# Patient Record
Sex: Female | Born: 1960 | Race: White | Hispanic: No | Marital: Married | Smoking: Former smoker
Health system: Southern US, Community
[De-identification: ages and names within clinical notes are randomized; demographics above are authoritative.]

## PROBLEM LIST (undated history)

## (undated) DIAGNOSIS — I10 Essential (primary) hypertension: Secondary | ICD-10-CM

## (undated) DIAGNOSIS — N951 Menopausal and female climacteric states: Secondary | ICD-10-CM

## (undated) DIAGNOSIS — M545 Low back pain: Principal | ICD-10-CM

## (undated) DIAGNOSIS — J309 Allergic rhinitis, unspecified: Secondary | ICD-10-CM

## (undated) DIAGNOSIS — K219 Gastro-esophageal reflux disease without esophagitis: Secondary | ICD-10-CM

## (undated) DIAGNOSIS — M1811 Unilateral primary osteoarthritis of first carpometacarpal joint, right hand: Secondary | ICD-10-CM

## (undated) DIAGNOSIS — G8929 Other chronic pain: Secondary | ICD-10-CM

## (undated) DIAGNOSIS — G43009 Migraine without aura, not intractable, without status migrainosus: Secondary | ICD-10-CM

## (undated) HISTORY — DX: Other chronic pain: G89.29

## (undated) HISTORY — DX: Migraine without aura, not intractable, without status migrainosus: G43.009

## (undated) HISTORY — DX: Menopausal and female climacteric states: N95.1

## (undated) HISTORY — DX: Unilateral primary osteoarthritis of first carpometacarpal joint, right hand: M18.11

## (undated) HISTORY — DX: Gastro-esophageal reflux disease without esophagitis: K21.9

## (undated) HISTORY — DX: Allergic rhinitis, unspecified: J30.9

## (undated) HISTORY — DX: Essential (primary) hypertension: I10

## (undated) HISTORY — DX: Low back pain: M54.5

---
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
## 1989-11-22 HISTORY — PX: APPENDECTOMY: SHX54

## 1992-11-22 HISTORY — PX: LAPAROSCOPIC LYSIS OF ADHESIONS: SHX5905

## 1997-11-22 HISTORY — PX: CHOLECYSTECTOMY: SHX55

## 2000-11-22 HISTORY — PX: TUBAL LIGATION: SHX77

## 2014-05-20 ENCOUNTER — Emergency Department (HOSPITAL_BASED_OUTPATIENT_CLINIC_OR_DEPARTMENT_OTHER): Payer: Medicaid Other

## 2014-05-20 ENCOUNTER — Encounter (HOSPITAL_BASED_OUTPATIENT_CLINIC_OR_DEPARTMENT_OTHER): Payer: Self-pay | Admitting: Emergency Medicine

## 2014-05-20 ENCOUNTER — Emergency Department (HOSPITAL_BASED_OUTPATIENT_CLINIC_OR_DEPARTMENT_OTHER)
Admission: EM | Admit: 2014-05-20 | Discharge: 2014-05-20 | Disposition: A | Discharge status: Home / Self Care | Payer: Medicaid Other | Attending: Emergency Medicine | Admitting: Emergency Medicine

## 2014-05-20 DIAGNOSIS — M545 Low back pain, unspecified: Secondary | ICD-10-CM | POA: Insufficient documentation

## 2014-05-20 MED ORDER — HYDROCODONE-ACETAMINOPHEN 5-325 MG PO TABS: 2.0000 | ORAL_TABLET | ORAL | Status: DC | PRN

## 2014-05-20 MED ORDER — CYCLOBENZAPRINE HCL 10 MG PO TABS: 10.0000 mg | ORAL_TABLET | Freq: Two times a day (BID) | ORAL | Status: DC | PRN

## 2014-05-20 MED ORDER — IBUPROFEN 800 MG PO TABS: 800.0000 mg | ORAL_TABLET | Freq: Three times a day (TID) | ORAL | Status: DC

## 2014-05-20 NOTE — ED Notes (Signed)
Pt c/o " tailbone and lower back pain " x 1 month

## 2014-05-20 NOTE — Discharge Instructions (Signed)

## 2014-05-20 NOTE — ED Provider Notes (Signed)
CSN: [id number]     Arrival date & time 05/20/14  1717 History   First MD Initiated Contact with Patient 05/20/14 1729     Chief Complaint  Patient presents with  . Back Pain     (Consider location/radiation/quality/duration/timing/severity/associated sxs/prior Treatment) Patient is a 53 y.o. female presenting with back pain. The history is provided by the patient. No language interpreter was used.  Back Pain Location:  Lumbar spine Quality:  Aching Radiates to:  Does not radiate Pain severity:  Moderate Pain is:  Unable to specify Onset quality:  Gradual Duration:  12 months Timing:  Intermittent Progression:  Waxing and waning Chronicity:  Recurrent Relieved by:  Nothing Ineffective treatments:  None tried Pt reports pain in low back after mowing yard.   Pt has pain on and off in low back and pelvic area.  History reviewed. No pertinent past medical history. Past Surgical History  Procedure Laterality Date  . Cholecystectomy    . Appendectomy    . Abdominal surgery    . Cesarean section    . Tubal ligation     History reviewed. No pertinent family history. History  Substance Use Topics  . Smoking status: Never Smoker   . Smokeless tobacco: Not on file  . Alcohol Use: No   OB History   Grav Para Term Preterm Abortions TAB SAB Ect Mult Living                 Review of Systems  Musculoskeletal: Positive for back pain.  All other systems reviewed and are negative.     Allergies  Sulfa antibiotics  Home Medications   Prior to Admission medications   Not on File   BP 140/93  Pulse 93  Temp(Src) 97.6 F (36.4 C) (Oral)  Resp 16  Ht 5\' 3"  (1.6 m)  Wt 180 lb (81.647 kg)  BMI 31.89 kg/m2  SpO2 96% Physical Exam  Vitals reviewed. Constitutional: She appears well-developed and well-nourished.  HENT:  Head: Normocephalic and atraumatic.  Right Ear: External ear normal.  Nose: Nose normal.  Mouth/Throat: Oropharynx is clear and moist.  Eyes:  Conjunctivae are normal. Pupils are equal, round, and reactive to light.  Neck: Normal range of motion.  Cardiovascular: Normal rate.   Pulmonary/Chest: Effort normal.  Abdominal: Soft.  Musculoskeletal:  Diffusely tender lower lumbar/sacral area.    Neurological: She is alert.  Skin: Skin is warm.    ED Course  Procedures (including critical care time) Labs Review Labs Reviewed - No data to display  Imaging Review Dg Lumbar Spine Complete  05/20/2014   CLINICAL DATA:  Pain in lower back  EXAM: LUMBAR SPINE - COMPLETE 4+ VIEW  COMPARISON:  None.  FINDINGS: There is no evidence of lumbar spine fracture. Alignment is normal. There is mild multi level disc space narrowing and ventral endplate spurring.  IMPRESSION: 1. Mild degenerative disc disease.   Electronically Signed   By: Signa Kellaylor  Stroud M.D.   On: 05/20/2014 18:51   Dg Pelvis 1-2 Views  05/20/2014   CLINICAL DATA:  Lower back and tailbone pain for 1 month  EXAM: PELVIS - 1-2 VIEW  COMPARISON:  None  FINDINGS: Symmetric hip and SI joints.  Osseous mineralization normal.  No definite fracture, dislocation, or bone destruction.  Few scattered pelvic phleboliths.  IMPRESSION: No acute osseous abnormalities.   Electronically Signed   By: Ulyses SouthwardMark  Boles M.D.   On: 05/20/2014 18:40     EKG Interpretation None  MDM mild degenerative disease disease   Final diagnoses:  Bilateral low back pain without sciatica    Pt has appointment with opc in August  For a check up.     Ibuprofen Flexeril And hydrocodone Pt advised to follow up as scheduled.   Referral to Dr. Pearletha ForgeHudnall for evaluation    Elson AreasLeslie K Opha Mcghee, PA-C 05/20/14 1919

## 2014-05-20 NOTE — ED Provider Notes (Signed)
Medical screening examination/treatment/procedure(s) were performed by non-physician practitioner and as supervising physician I was immediately available for consultation/collaboration.    Linwood DibblesJon Knapp, MD 05/20/14 [phone]

## 2014-11-08 ENCOUNTER — Encounter: Payer: Self-pay | Admitting: Internal Medicine

## 2014-11-08 ENCOUNTER — Ambulatory Visit (INDEPENDENT_AMBULATORY_CARE_PROVIDER_SITE_OTHER): Payer: Self-pay | Admitting: Internal Medicine

## 2014-11-08 VITALS — BP 132/88 | HR 79 | Temp 97.9°F | Wt 173.3 lb

## 2014-11-08 DIAGNOSIS — G43009 Migraine without aura, not intractable, without status migrainosus: Secondary | ICD-10-CM

## 2014-11-08 DIAGNOSIS — M545 Low back pain, unspecified: Secondary | ICD-10-CM

## 2014-11-08 DIAGNOSIS — E669 Obesity, unspecified: Secondary | ICD-10-CM | POA: Insufficient documentation

## 2014-11-08 DIAGNOSIS — N951 Menopausal and female climacteric states: Secondary | ICD-10-CM

## 2014-11-08 DIAGNOSIS — Z23 Encounter for immunization: Secondary | ICD-10-CM

## 2014-11-08 DIAGNOSIS — K219 Gastro-esophageal reflux disease without esophagitis: Secondary | ICD-10-CM

## 2014-11-08 DIAGNOSIS — Z0000 Encounter for general adult medical examination without abnormal findings: Secondary | ICD-10-CM | POA: Insufficient documentation

## 2014-11-08 DIAGNOSIS — J309 Allergic rhinitis, unspecified: Secondary | ICD-10-CM | POA: Insufficient documentation

## 2014-11-08 DIAGNOSIS — G8929 Other chronic pain: Secondary | ICD-10-CM

## 2014-11-08 HISTORY — DX: Gastro-esophageal reflux disease without esophagitis: K21.9

## 2014-11-08 HISTORY — DX: Other chronic pain: G89.29

## 2014-11-08 HISTORY — DX: Low back pain, unspecified: M54.50

## 2014-11-08 HISTORY — DX: Allergic rhinitis, unspecified: J30.9

## 2014-11-08 HISTORY — DX: Migraine without aura, not intractable, without status migrainosus: G43.009

## 2014-11-08 HISTORY — DX: Menopausal and female climacteric states: N95.1

## 2014-11-08 MED ORDER — CYCLOBENZAPRINE HCL 5 MG PO TABS: 5.0000 mg | ORAL_TABLET | Freq: Two times a day (BID) | ORAL | Status: DC | PRN

## 2014-11-08 MED ORDER — HYDROCODONE-ACETAMINOPHEN 5-325 MG PO TABS: 1.0000 | ORAL_TABLET | Freq: Four times a day (QID) | ORAL | Status: DC | PRN

## 2014-11-08 MED ORDER — IBUPROFEN 800 MG PO TABS: 800.0000 mg | ORAL_TABLET | Freq: Three times a day (TID) | ORAL | Status: AC | PRN

## 2014-11-08 NOTE — Assessment & Plan Note (Signed)
She has occasional epigastric dyspepsia which responds well to as needed TUMS are her sister's Zegrid. She will continue with the as needed TUMS therapy for her gastroesophageal reflux disease. I told her that if her symptoms did not respond to the [facility] and she wanted prescription strength therapy to call and leave a message and I would be happy to prescribe PPI therapy or an H2 blocker at that time. We will reassess her symptoms at the follow-up visit

## 2014-11-08 NOTE — Assessment & Plan Note (Signed)
She was recently started on hydrocodone-acetaminophen 5-325 mg 1-2 tablets by mouth every 6 hours as needed for pain; dispense #20. In addition, she was given ibuprofen 800 mg by mouth every 8 hours as needed for pain and cyclobenzaprine 10 mg by mouth every 8 hours as needed for spasms. This therapy has worked reasonably well in controlling her chronic low back pain. We decided to continue with this therapy given its effectiveness. I prescribed hydrocodone-acetaminophen 5-325 mg 1-2 tablets by mouth every 6 hours as needed for pain dispense #30 per month. A formal pain contract was signed. Ibuprofen was written for 800 mg by mouth every 8 hours as needed for pain to be taken with food. The cyclobenzaprine dose was decreased to 5 mg by mouth every 8 hours as needed for muscle spasm as she fell on the 10 mg dose to be too sedating. We will reassess the control of her chronic low back pain on this regimen at the follow-up visit.

## 2014-11-08 NOTE — Assessment & Plan Note (Signed)
She received the Tdap vaccination today. She deferred the flu vaccination. She also wanted to wait on referral for mammography, colonoscopy, and a Pap smear. This was related to her current financial situation where she has recently lost Medicaid and is without insurance. Once she sorts her health insurance issues out she is more than happy to be referred for mammography and colonoscopy and would allow me to perform Pap smear testing. We will also obtain a lipid panel and any other appropriate labs at the follow-up visit when she obtains health insurance.

## 2014-11-08 NOTE — Assessment & Plan Note (Signed)
She now has symptoms consistent with her menopause including hot flashes, diaphoresis, and occasional agitation. I offered her SSRI therapy in hopes of decreasing her symptoms. I also offered her estrogen therapy. She preferred to go with natural therapies that she's read about rather than pharmacotherapy. She was very much against hormonal therapy given a previous reaction she had when she was taking birth control pills. We will reassess her vasomotor symptoms at the follow-up visit, and if appropriate, reintroduce the idea of pharmacotherapy in hopes of reducing her symptoms.

## 2014-11-08 NOTE — Addendum Note (Signed)
Addended by: Doneen PoissonKLIMA, Shirline Kendle D on: 11/08/2014 05:09 PM   Modules accepted: Orders

## 2014-11-08 NOTE — Progress Notes (Signed)
   Subjective:    Patient ID: Emily Le, female    DOB: 29-Jan-1961, 53 y.o.   MRN: [id number]  HPI  Please see the A&P for the status of the pt's chronic medical problems.  Review of Systems  Constitutional: Positive for diaphoresis. Negative for fever, chills, activity change, appetite change, fatigue and unexpected weight change.  HENT: Negative for congestion and sinus pressure.   Respiratory: Negative for cough, chest tightness, shortness of breath and wheezing.   Cardiovascular: Negative for chest pain, palpitations and leg swelling.  Gastrointestinal: Positive for diarrhea. Negative for nausea, vomiting, abdominal pain, constipation and abdominal distention.       Loose stools for last 3 days  Endocrine: Positive for heat intolerance. Negative for cold intolerance.  Musculoskeletal: Positive for back pain. Negative for myalgias, joint swelling, arthralgias, gait problem, neck pain and neck stiffness.  Skin: Negative for color change, pallor, rash and wound.  Allergic/Immunologic: Positive for environmental allergies.  Neurological: Positive for headaches. Negative for seizures, syncope, weakness and numbness.  Hematological: Negative for adenopathy. Does not bruise/bleed easily.  Psychiatric/Behavioral: Positive for dysphoric mood. The patient is nervous/anxious.       Objective:   Physical Exam  Constitutional: She is oriented to person, place, and time. She appears well-developed and well-nourished. No distress.  HENT:  Head: Normocephalic and atraumatic.  Eyes: Conjunctivae are normal. Right eye exhibits no discharge. Left eye exhibits no discharge. No scleral icterus.  Neck: Normal range of motion. Neck supple. No JVD present. No tracheal deviation present. No thyromegaly present.  Cardiovascular: Normal rate, regular rhythm and normal heart sounds.  Exam reveals no gallop and no friction rub.   No murmur heard. Pulmonary/Chest: Effort normal and breath sounds normal. No  stridor. No respiratory distress. She has no wheezes. She has no rales.  Abdominal: Soft. Bowel sounds are normal. She exhibits no distension. There is no tenderness. There is no rebound and no guarding.  Musculoskeletal: Normal range of motion. She exhibits no edema or tenderness.  Lymphadenopathy:    She has no cervical adenopathy.  Neurological: She is alert and oriented to person, place, and time. She exhibits normal muscle tone.  Skin: Skin is warm and dry. No rash noted. She is not diaphoretic. No erythema. No pallor.  Psychiatric: She has a normal mood and affect. Her behavior is normal. Judgment and thought content normal.  Nursing note and vitals reviewed.     Assessment & Plan:   Please see problem oriented charting.

## 2014-11-08 NOTE — Patient Instructions (Signed)
It was great to sit down with you and talking about your health in more detail.  1) Keep taking your medications as you are.  2) At the next visit we will check your blood work.  3) Today we gave you a tetanus shot.  4)  As you work on [employer] insurance issues we will hold off on a Pap Smear, Mammogram, and colonoscopy.  5) If you want treatment for your hot flashes, as we discussed, let me know and I can prescribe something.  I will see you in 6 months, sooner if necessary.

## 2014-11-08 NOTE — Assessment & Plan Note (Signed)
She has noted unilateral, disabling headaches associated with her periods. These are migrainous in character and likely represent migraines related to her hormonal changes. She is continuing to take as needed analgesics for this pain when it occurs. I suspect that as she moves through perimenopause the symptoms will resolve. We will reassess her migraines at the follow-up visit and initiate any other therapies as needed and appropriate.

## 2014-12-10 ENCOUNTER — Other Ambulatory Visit: Payer: Self-pay | Admitting: Internal Medicine

## 2014-12-10 DIAGNOSIS — M545 Low back pain: Principal | ICD-10-CM

## 2014-12-10 DIAGNOSIS — G8929 Other chronic pain: Secondary | ICD-10-CM

## 2014-12-10 MED ORDER — HYDROCODONE-ACETAMINOPHEN 5-325 MG PO TABS: 1.0000 | ORAL_TABLET | Freq: Four times a day (QID) | ORAL | Status: DC | PRN

## 2015-03-14 ENCOUNTER — Other Ambulatory Visit: Payer: Self-pay | Admitting: Internal Medicine

## 2015-03-14 DIAGNOSIS — G8929 Other chronic pain: Secondary | ICD-10-CM

## 2015-03-14 DIAGNOSIS — M545 Low back pain: Principal | ICD-10-CM

## 2015-03-14 MED ORDER — HYDROCODONE-ACETAMINOPHEN 5-325 MG PO TABS: 1.0000 | ORAL_TABLET | Freq: Four times a day (QID) | ORAL | Status: DC | PRN

## 2015-05-16 ENCOUNTER — Other Ambulatory Visit: Payer: Self-pay | Admitting: Internal Medicine

## 2015-05-16 DIAGNOSIS — G8929 Other chronic pain: Secondary | ICD-10-CM

## 2015-05-16 DIAGNOSIS — M545 Low back pain: Principal | ICD-10-CM

## 2015-05-16 MED ORDER — HYDROCODONE-ACETAMINOPHEN 5-325 MG PO TABS: 1.0000 | ORAL_TABLET | Freq: Four times a day (QID) | ORAL | Status: DC | PRN

## 2015-08-22 ENCOUNTER — Ambulatory Visit (INDEPENDENT_AMBULATORY_CARE_PROVIDER_SITE_OTHER): Payer: Self-pay | Admitting: Internal Medicine

## 2015-08-22 ENCOUNTER — Encounter: Payer: Self-pay | Admitting: Internal Medicine

## 2015-08-22 VITALS — BP 147/93 | HR 79 | Temp 98.0°F | Wt 176.7 lb

## 2015-08-22 DIAGNOSIS — Z0000 Encounter for general adult medical examination without abnormal findings: Secondary | ICD-10-CM

## 2015-08-22 DIAGNOSIS — M1811 Unilateral primary osteoarthritis of first carpometacarpal joint, right hand: Secondary | ICD-10-CM

## 2015-08-22 DIAGNOSIS — G8929 Other chronic pain: Secondary | ICD-10-CM

## 2015-08-22 DIAGNOSIS — M19041 Primary osteoarthritis, right hand: Secondary | ICD-10-CM

## 2015-08-22 DIAGNOSIS — M545 Low back pain, unspecified: Secondary | ICD-10-CM

## 2015-08-22 DIAGNOSIS — G43709 Chronic migraine without aura, not intractable, without status migrainosus: Secondary | ICD-10-CM

## 2015-08-22 DIAGNOSIS — N951 Menopausal and female climacteric states: Secondary | ICD-10-CM

## 2015-08-22 DIAGNOSIS — R002 Palpitations: Secondary | ICD-10-CM | POA: Insufficient documentation

## 2015-08-22 DIAGNOSIS — G43009 Migraine without aura, not intractable, without status migrainosus: Secondary | ICD-10-CM

## 2015-08-22 DIAGNOSIS — N958 Other specified menopausal and perimenopausal disorders: Secondary | ICD-10-CM

## 2015-08-22 HISTORY — DX: Unilateral primary osteoarthritis of first carpometacarpal joint, right hand: M18.11

## 2015-08-22 MED ORDER — HYDROCODONE-ACETAMINOPHEN 5-325 MG PO TABS: 1.0000 | ORAL_TABLET | Freq: Four times a day (QID) | ORAL | Status: DC | PRN

## 2015-08-22 NOTE — Assessment & Plan Note (Signed)
She notes continued chronic low back pain that radiates to the right flank which is chronic and unchanged. The Flexeril at 5 mg by mouth as needed and hydrocodone 5-325 mg 1-2 tablets every 6 hours as needed dispense #30 per month are effective in improving her pain to a tolerable level that allows her to achieve her daily requirements at home. There are no associated red flags such as fevers or weakness. We will continue with the hydrocodone 5-325 mg and cyclobenzaprine 5 mg both as needed. She will also continue with the as needed ibuprofen which she has been taking rarely.

## 2015-08-22 NOTE — Patient Instructions (Signed)
It was good to see you again.  1) Your blood pressure is up today.  We will check it again at the next appointment.  2) Keep taking the medications as you are.  3) We will hold off on any blood work or screening tests waiting for insurance coverage.  I will see you back in 1 year, sooner if necessary.

## 2015-08-22 NOTE — Assessment & Plan Note (Signed)
She has noted pain in her right thumb as of recent time. This is associated with any manipulation of the hand that requires pinching. There is point tenderness at the base of the thumb suggesting first MCP osteoarthritis. The pain has not responded to the as needed hydrocodone, she took her most recent dose this morning and still has this thumb pain. I advised her to try as needed ibuprofen to see if the non-steroidal anti-inflammatory agent would help with her pain. We will reassess the control of her pain using the ibuprofen at the follow-up visit.

## 2015-08-22 NOTE — Progress Notes (Signed)
   Subjective:    Patient ID: Emily Le, female    DOB: [birth year], 54 y.o.   MRN: [id number]  HPI  Emily Le is here for follow-up of her chronic low back pain, vasomotor symptoms associated with menopause, and chronic migraines. Please see the A&P for the status of the pt's chronic medical problems.  Review of Systems  Constitutional: Negative for activity change.  Respiratory: Negative for cough, chest tightness, shortness of breath and wheezing.   Cardiovascular: Positive for palpitations. Negative for chest pain and leg swelling.       Occasional transient palpitations not associated with CP, SOB, or dizziness.  Will break with a cough.  Can occur in bunches where she may have a few episodes in a week and then none for a very long time.  Gastrointestinal: Negative for nausea, vomiting and diarrhea.  Genitourinary: Positive for flank pain and pelvic pain.       She has chronic pelvic and right flank pain originating around the time she had issues with endometriosis requiring interviention.  Musculoskeletal: Positive for myalgias, back pain and arthralgias. Negative for joint swelling.       Chronic low back pain, right hip pain, and myalgias between shoulder blades.  New pain at the base of the right thumb suggestive of degenerative joint pain.  Neurological: Positive for headaches. Negative for syncope.       Migraine headaches persist but are improving as vasomotor symptoms are lessening.  Psychiatric/Behavioral: The patient is nervous/anxious.        Occasional mild anxiety about several stressors in life.      Objective:   Physical Exam  Constitutional: She is oriented to person, place, and time. She appears well-developed and well-nourished. No distress.  HENT:  Head: Normocephalic and atraumatic.  Eyes: Conjunctivae are normal. Right eye exhibits no discharge. Left eye exhibits no discharge. No scleral icterus.  Neck: Normal range of motion. Neck supple.  Cardiovascular:  Normal rate, regular rhythm and normal heart sounds.  Exam reveals no gallop and no friction rub.   No murmur heard. Pulmonary/Chest: Effort normal and breath sounds normal. No respiratory distress. She has no wheezes. She has no rales.  Abdominal: Soft. She exhibits no distension. There is no tenderness. There is no rebound and no guarding.  Musculoskeletal: Normal range of motion. She exhibits tenderness. She exhibits no edema.  Point tenderness at base of right thumb  Neurological: She is alert and oriented to person, place, and time. She exhibits normal muscle tone.  Skin: Skin is warm and dry. No rash noted. She is not diaphoretic. No erythema.  Psychiatric: She has a normal mood and affect. Her behavior is normal. Judgment and thought content normal.  Nursing note and vitals reviewed.     Assessment & Plan:   Please see problem oriented charting.

## 2015-08-22 NOTE — Assessment & Plan Note (Signed)
She continues to have occasional periods and associated vasomotor symptoms although these vasomotor symptoms are improving over the past year. At this point they are not significant enough to require any pharmacologic intervention. We will continue to follow these symptoms expectantly.

## 2015-08-22 NOTE — Assessment & Plan Note (Signed)
She has been experiencing intermittent palpitations of the heart that is not associated with chest pain, shortness of breath, or dizziness. These are transient episodes that last seconds to a few minutes. They resolve spontaneously. They occur in bunches with several possibly occurring within a week and then none occurring for a very long time thereafter. They do not occur daily. They are not associated with any particular activity. She does admit that at times they occur when she is anxious. Other than her obesity and sedentary status her cardiovascular risk factors are minimal. The intermittent and transient nature of these episodes without any associated symptoms also hints towards a benign etiology. She has no insurance coverage and costs of diagnostic workup including a Holter monitor or event monitor would impose a significant financial burden upon her. We are trying to see what the out-of-pocket costs are for these studies would she want to pursue them but at this point, based on the clinical evaluation and risk factor assessment, further workup is not required at this time and we will follow symptomatically.

## 2015-08-22 NOTE — Assessment & Plan Note (Signed)
As she goes further into perimenopause and has less vasomotor symptoms her migraines have been occurring less frequently. She expects this to continue to improve as she ages. We will therefore continue to follow without the need to provide anything other than as needed ibuprofen.

## 2015-08-22 NOTE — Assessment & Plan Note (Signed)
Her blood pressure was elevated today in the 140s over 90s. At the last visit her blood pressure was in the 130s over 80s. Before labeling her with a diagnosis of essential hypertension we will repeat the blood pressure the next time she is in clinic. As she accompanies her husband every 3 months we will repeat the blood pressure during one of his visits. If it remains elevated we will offer dietary counseling and/or pharmacologic therapy.  She is not interested in the influenza vaccine. In addition, given her tight finances and lack of insurance, she would like to defer her Pap smear, mammogram, fecal occult blood testing, hep C assessment, and HIV status until a later time. She is hopeful she will be able to get insurance coverage. If she cannot, she is agreeable to doing preventative care in a piecemeal fashion to limit the costs at any one time. Of note, she states she had a colonoscopy which was unremarkable approximately 10 years ago so fecal occult blood testing may be able to be deferred for one year.

## 2015-08-27 LAB — PRESCRIPTION ABUSE MONITORING 17P, URINE
6-Acetylmorphine, Urine: NEGATIVE ng/mL
Amphetamine Scrn, Ur: NEGATIVE ng/mL
BARBITURATE SCREEN URINE: NEGATIVE ng/mL
BENZODIAZEPINE SCREEN, URINE: NEGATIVE ng/mL
Buprenorphine, Urine: NEGATIVE ng/mL
CANNABINOIDS UR QL SCN: NEGATIVE ng/mL
Carisoprodol/Meprobamate, Ur: NEGATIVE ng/mL
Cocaine (Metab) Scrn, Ur: NEGATIVE ng/mL
Creatinine(Crt), U: 150.5 mg/dL (ref 20.0–300.0)
EDDP, Urine: NEGATIVE ng/mL
Fentanyl, Urine: NEGATIVE pg/mL
MDMA Screen, Urine: NEGATIVE ng/mL
Meperidine Screen, Urine: NEGATIVE ng/mL
Methadone Screen, Urine: NEGATIVE ng/mL
Nitrite Urine, Quantitative: NEGATIVE ug/mL
OXYCODONE+OXYMORPHONE UR QL SCN: NEGATIVE ng/mL
Ph of Urine: 7.1 (ref 4.5–8.9)
Phencyclidine Qn, Ur: NEGATIVE ng/mL
Propoxyphene Scrn, Ur: NEGATIVE ng/mL
SPECIFIC GRAVITY: 1.02
Tapentadol, Urine: NEGATIVE ng/mL
Tramadol Screen, Urine: NEGATIVE ng/mL

## 2015-08-27 LAB — OPIATES CONFIRMATION, URINE
CODEINE: NEGATIVE
HYDROCODONE CONFIRM: 1110 ng/mL
HYDROCODONE: POSITIVE — AB
HYDROMORPHONE CONF UR: 660 ng/mL
HYDROMORPHONE: POSITIVE — AB
MORPHINE: NEGATIVE
OPIATES: POSITIVE ng/mL — AB

## 2015-08-29 ENCOUNTER — Ambulatory Visit: Payer: Self-pay

## 2015-10-14 ENCOUNTER — Encounter: Payer: Self-pay | Admitting: Student

## 2015-10-14 NOTE — Progress Notes (Signed)
UDS: Positive for hydromorphone (expected)

## 2015-11-07 ENCOUNTER — Ambulatory Visit (HOSPITAL_COMMUNITY)
Admission: RE | Admit: 2015-11-07 | Discharge: 2015-11-07 | Disposition: A | Discharge status: Home / Self Care | Payer: Self-pay | Source: Ambulatory Visit | Attending: Internal Medicine | Admitting: Internal Medicine

## 2015-11-07 ENCOUNTER — Other Ambulatory Visit: Payer: Self-pay | Admitting: Internal Medicine

## 2015-11-07 DIAGNOSIS — M79644 Pain in right finger(s): Secondary | ICD-10-CM | POA: Insufficient documentation

## 2015-11-07 DIAGNOSIS — M19041 Primary osteoarthritis, right hand: Secondary | ICD-10-CM | POA: Insufficient documentation

## 2015-11-07 DIAGNOSIS — M7989 Other specified soft tissue disorders: Secondary | ICD-10-CM | POA: Insufficient documentation

## 2015-11-07 DIAGNOSIS — M1811 Unilateral primary osteoarthritis of first carpometacarpal joint, right hand: Secondary | ICD-10-CM

## 2015-11-28 ENCOUNTER — Encounter: Payer: Self-pay | Admitting: Internal Medicine

## 2015-11-28 ENCOUNTER — Ambulatory Visit (INDEPENDENT_AMBULATORY_CARE_PROVIDER_SITE_OTHER): Payer: Self-pay | Admitting: Internal Medicine

## 2015-11-28 ENCOUNTER — Other Ambulatory Visit: Payer: Self-pay

## 2015-11-28 VITALS — BP 122/84 | HR 99 | Temp 98.0°F | Wt 180.9 lb

## 2015-11-28 DIAGNOSIS — G8929 Other chronic pain: Secondary | ICD-10-CM

## 2015-11-28 DIAGNOSIS — N951 Menopausal and female climacteric states: Secondary | ICD-10-CM

## 2015-11-28 DIAGNOSIS — M1811 Unilateral primary osteoarthritis of first carpometacarpal joint, right hand: Secondary | ICD-10-CM

## 2015-11-28 DIAGNOSIS — G43009 Migraine without aura, not intractable, without status migrainosus: Secondary | ICD-10-CM

## 2015-11-28 DIAGNOSIS — G43001 Migraine without aura, not intractable, with status migrainosus: Secondary | ICD-10-CM

## 2015-11-28 DIAGNOSIS — M545 Low back pain, unspecified: Secondary | ICD-10-CM

## 2015-11-28 DIAGNOSIS — R002 Palpitations: Secondary | ICD-10-CM

## 2015-11-28 DIAGNOSIS — Z1239 Encounter for other screening for malignant neoplasm of breast: Secondary | ICD-10-CM

## 2015-11-28 DIAGNOSIS — Z780 Asymptomatic menopausal state: Secondary | ICD-10-CM

## 2015-11-28 DIAGNOSIS — M19041 Primary osteoarthritis, right hand: Secondary | ICD-10-CM

## 2015-11-28 MED ORDER — HYDROCODONE-ACETAMINOPHEN 5-325 MG PO TABS: 1.0000 | ORAL_TABLET | Freq: Four times a day (QID) | ORAL | Status: DC | PRN

## 2015-11-28 NOTE — Patient Instructions (Signed)
It was good to see you again.  1) We made a referral for mammography.  They should call you to schedule an appointment.  2) Keep taking your other medications as you are.  3) Call me if the palpitations get worse or your decide you want me to order the Holter monitor.  4) We will continue to watch your arthritis pain in the hands.  5) I will see you in 3 months, sooner if necessary.

## 2015-11-29 NOTE — Assessment & Plan Note (Signed)
Assessment  Right thumb base arthritic pain is unchanged from the last visit.  X-ray shows a subchondral cyst with possible erosion that could be secondary to either an erosive arthritis or degenerative arthritis.  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  It has symptomatically responded to as needed ibuprofen.  Plan  We will continue to follow symptomatically look for the development of other small joint symptoms in a symmetric pattern that would be more suggestive of RA.  Thus at this point we will not pursue further serologic work-up unless he symptoms and examination changes.  We will continue the ibuprofen 800 mg every 8 hours as needed for pain.

## 2015-11-29 NOTE — Assessment & Plan Note (Signed)
Costs remain an issue as she does not have insurance.  We will therefore pursue health maintenance in a piecemeal fashion so as not to disrupt the family finances too greatly.  At this point a referral has been made for mammography.

## 2015-11-29 NOTE — Assessment & Plan Note (Signed)
Assessment  She continues to have periods about every 5 weeks although will occasional skip a period or have only 2 days of light menses.  Vasomotor symptoms continue to be problematic.  Plan  Will offer SSRI therapy at the follow-up visit for the vasomotor symptoms at the follow-up visit if these persist or worsen.

## 2015-11-29 NOTE — Assessment & Plan Note (Signed)
Assessment  Frequency and intensity improving thorough perimenopause.  Plan  We will continue as needed ibuprofen 800 mg every 8 hours as needed for migraine headaches and reassess at the follow-up visit.

## 2015-11-29 NOTE — Assessment & Plan Note (Signed)
Assessment  Her chronic low back pain is stable and responds to hydrocodone 5-325 mg as needed (disp #30/month) and she is able to maintain functionality to include maintaining her house for her family.  Plan  Continue the hydrocodone 5-325 mg (dispense #30/month) for severe pain and ibuprofen 800 mg every 8 hours as needed for mild to moderate low back pain.  We will reassess pain control and functionality at the follow-up visit.

## 2015-11-29 NOTE — Progress Notes (Signed)
   Subjective:    Patient ID: Emily Le, female    DOB: [birth year], 55 y.o.   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 Please see the A&P for the status of the pt's chronic medical problems.  Review of Systems  Constitutional: Negative for activity change, appetite change and unexpected weight change.  Respiratory: Negative for cough, chest tightness and shortness of breath.   Cardiovascular: Positive for palpitations. Negative for chest pain and leg swelling.       Continues to have intermittent palpitations that can occur at any tie and last for seconds to a few minutes.  Nothing seems to bring them on and nothing alleviates them.  They are not correlated with exertion.  The frequency may be slightly less than at the last visit.  There are no associated chest pain, dizziness, or SOB symptoms with these episodes.  Gastrointestinal: Positive for nausea and diarrhea. Negative for vomiting, abdominal pain and constipation.       She had one episode of nausea and brief diarrhea on Christmas day which was self limited and has not recurred.  Musculoskeletal: Positive for arthralgias. Negative for myalgias and joint swelling.  Skin: Negative for color change and rash.  Neurological: Positive for headaches. Negative for dizziness, weakness, light-headedness and numbness.       Brief headache on christmas day.  Migraines seem to be improving in terms of frequency and intensity.  Psychiatric/Behavioral: Negative for dysphoric mood. The patient is not nervous/anxious.       Objective:   Physical Exam  Constitutional: She is oriented to person, place, and time. She appears well-developed and well-nourished. No distress.  HENT:  Head: Normocephalic and atraumatic.  Eyes: Conjunctivae are normal. Right eye exhibits no discharge. Left eye exhibits no discharge. No scleral icterus.  Cardiovascular:  Normal rate, regular rhythm and normal heart sounds.  Exam reveals no gallop and no friction rub.   No murmur heard. Pulmonary/Chest: Effort normal and breath sounds normal. No respiratory distress. She has no wheezes. She has no rales.  Abdominal: Soft. Bowel sounds are normal. She exhibits no distension. There is no tenderness. There is no rebound and no guarding.  Musculoskeletal: Normal range of motion. She exhibits no edema or tenderness.  Neurological: She is alert and oriented to person, place, and time. She exhibits normal muscle tone.  Skin: Skin is warm and dry. No rash noted. She is not diaphoretic. No erythema.  Psychiatric: She has a normal mood and affect. Her behavior is normal. Judgment and thought content normal.  Nursing note and vitals reviewed.     Assessment & Plan:   Please see problem oriented charting.

## 2015-11-29 NOTE — Assessment & Plan Note (Signed)
Assessment  She continues to have brief intermittent palpitations of unclear etiology.  They seem to be improving slightly as she proceeds through menopause and are not associated with other symptoms or particular activities.  Plan  We will continue to follow clinically as they are slightly improved.  My suspicion for a serious cardiac abnormality is very low.  If they worsen or she becomes more alarmed or concerned she was asked to call and I would place a referral for a Holter monitor to further evaluate.  We will reassess at the follow-up visit.

## 2016-01-08 ENCOUNTER — Ambulatory Visit: Payer: Self-pay

## 2016-01-21 ENCOUNTER — Other Ambulatory Visit: Payer: Self-pay | Admitting: Internal Medicine

## 2016-01-21 DIAGNOSIS — M545 Low back pain, unspecified: Secondary | ICD-10-CM

## 2016-01-21 DIAGNOSIS — G8929 Other chronic pain: Secondary | ICD-10-CM

## 2016-02-27 ENCOUNTER — Other Ambulatory Visit: Payer: Self-pay | Admitting: Internal Medicine

## 2016-02-27 DIAGNOSIS — G8929 Other chronic pain: Secondary | ICD-10-CM

## 2016-02-27 DIAGNOSIS — M545 Low back pain: Principal | ICD-10-CM

## 2016-02-27 MED ORDER — HYDROCODONE-ACETAMINOPHEN 5-325 MG PO TABS: 1.0000 | ORAL_TABLET | Freq: Four times a day (QID) | ORAL | Status: DC | PRN

## 2016-02-27 NOTE — Telephone Encounter (Signed)
Patient was given the three written prescriptions to prevent her from having to drive back each month to pick them up, which is a significant burden.

## 2016-03-11 ENCOUNTER — Telehealth: Payer: Self-pay | Admitting: Internal Medicine

## 2016-03-11 NOTE — Telephone Encounter (Signed)
APPT. REMINDER CALL, LMTCB °

## 2016-03-12 ENCOUNTER — Ambulatory Visit: Payer: Self-pay | Admitting: Internal Medicine

## 2016-04-16 ENCOUNTER — Ambulatory Visit: Payer: Self-pay | Admitting: Internal Medicine

## 2016-05-19 ENCOUNTER — Other Ambulatory Visit: Payer: Self-pay | Admitting: *Deleted

## 2016-05-19 DIAGNOSIS — G8929 Other chronic pain: Secondary | ICD-10-CM

## 2016-05-19 DIAGNOSIS — M545 Low back pain, unspecified: Secondary | ICD-10-CM

## 2016-05-20 MED ORDER — HYDROCODONE-ACETAMINOPHEN 5-325 MG PO TABS: 1.0000 | ORAL_TABLET | Freq: Four times a day (QID) | ORAL | Status: DC | PRN

## 2016-06-25 ENCOUNTER — Encounter (INDEPENDENT_AMBULATORY_CARE_PROVIDER_SITE_OTHER): Payer: Self-pay

## 2016-06-25 ENCOUNTER — Ambulatory Visit (INDEPENDENT_AMBULATORY_CARE_PROVIDER_SITE_OTHER): Payer: Self-pay | Admitting: Internal Medicine

## 2016-06-25 ENCOUNTER — Encounter: Payer: Self-pay | Admitting: Internal Medicine

## 2016-06-25 DIAGNOSIS — I10 Essential (primary) hypertension: Secondary | ICD-10-CM

## 2016-06-25 DIAGNOSIS — L989 Disorder of the skin and subcutaneous tissue, unspecified: Secondary | ICD-10-CM

## 2016-06-25 DIAGNOSIS — G8929 Other chronic pain: Secondary | ICD-10-CM

## 2016-06-25 DIAGNOSIS — M1811 Unilateral primary osteoarthritis of first carpometacarpal joint, right hand: Secondary | ICD-10-CM

## 2016-06-25 DIAGNOSIS — M19041 Primary osteoarthritis, right hand: Secondary | ICD-10-CM

## 2016-06-25 DIAGNOSIS — M545 Low back pain: Secondary | ICD-10-CM

## 2016-06-25 DIAGNOSIS — Z0000 Encounter for general adult medical examination without abnormal findings: Secondary | ICD-10-CM

## 2016-06-25 DIAGNOSIS — N951 Menopausal and female climacteric states: Secondary | ICD-10-CM

## 2016-06-25 DIAGNOSIS — R002 Palpitations: Secondary | ICD-10-CM

## 2016-06-25 DIAGNOSIS — G43009 Migraine without aura, not intractable, without status migrainosus: Secondary | ICD-10-CM

## 2016-06-25 HISTORY — DX: Essential (primary) hypertension: I10

## 2016-06-25 MED ORDER — HYDROCODONE-ACETAMINOPHEN 5-325 MG PO TABS: 1.0000 | ORAL_TABLET | Freq: Four times a day (QID) | ORAL | 0 refills | Status: DC | PRN

## 2016-06-25 NOTE — Assessment & Plan Note (Signed)
Assessment  She continues to have a slow delay in her period frequency. This is consistent with the mental process or process. Her vasomotor symptoms are persisting but not significant enough for her to want to try SSRI therapy. As expected, her migraines and palpitations are also becoming much less frequent and intense.  Plan  At this point she remains on interested in SSRI therapy for her vasomotor symptoms. We will continue to follow expectantly.

## 2016-06-25 NOTE — Assessment & Plan Note (Signed)
Assessment  She is having less of the intermittent palpitations with time and as she moves through her a menopausal phase.  Plan  We will continue to monitor the intensity, frequency, and symptomatic nature of her intermittent palpitations but fully expect they will likely resolve as she completes her perimenopausal status and moves into menopause.

## 2016-06-25 NOTE — Assessment & Plan Note (Signed)
Assessment  She now qualifies for a diagnosis of essential hypertension. I suspect this is related to her weight. She is aware that she needs to lose the weight.  Plan  We will reassess her blood pressure at the follow-up visit, especially in light of whether or not she has been able to achieve any weight loss. If her blood pressure remains above target we will offer pharmacologic intervention in order to normalize her blood pressure.

## 2016-06-25 NOTE — Assessment & Plan Note (Signed)
Assessment  She has a nonhealing very small left lateral nasal external lesion that appears to be a very superficial ulceration or mild laceration. There are no abnormalities to the skin in this area that I can appreciate. I suspect this may have been a laceration that has been slow to heal as she states it is improving from when it started. She is fair skinned and rarely uses sunscreen so it could represent skin related damage or a skin related neoplasm.  Plan  She was given some bacitracin ointment to use and if there is no healing over the next 4 weeks she is to call and leave me a message. At that point I would refer her to a dermatologist to assess the lesion and consider biopsy if it was felt to be necessary. If the lesion heals then no further evaluation is necessary.

## 2016-06-25 NOTE — Patient Instructions (Addendum)
It was great to see you again.  1) Keep taking the medications as you are.  2) Good job with the sodas and cutting them back.  3) Try the antibiotic ointment on your nose.  If there is no healing over the next month give me a call and I will refer you to a dermatologist.  4) I gave you October's narcotic prescription so you should be good for the next 3 months.  I will see you back in 6 months, sooner if necessary.

## 2016-06-25 NOTE — Assessment & Plan Note (Signed)
We continue to defer health maintenance while waiting her obtainment of insurance coverage in order to pay for these services. This will be reassessed at the follow-up visit.

## 2016-06-25 NOTE — Assessment & Plan Note (Signed)
Assessment  Her back pain is reasonably well controlled on the ibuprofen 800 mg by mouth every 8 hours as needed, Flexeril 5 mg by mouth every 12 hours as needed, and hydrocodone-acetaminophen 5-325 mg 1-2 tablets every 6 hours as needed dispense #30 per month. With this therapy she is quite functional and able to do all of the necessary chores that are required in her daily life.  Plan  We will continue with the above medications for her chronic low back pain and reassess the efficacy in managing her symptoms and improving her functionality at the follow-up visit.

## 2016-06-25 NOTE — Assessment & Plan Note (Signed)
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 She does notice some stiffness in the hands every morning but this is been name mild inconvenience for her and quickly resolves.  Plan  We will continue with the as needed ibuprofen 800 mg by mouth every 8 hours. We will reassess her symptoms at the follow-up visit.

## 2016-06-25 NOTE — Progress Notes (Signed)
   Subjective:    Patient ID: Emily Le, female    DOB: October 09, 1961, 55 y.o.   MRN: [id number]  HPI  Emily Le is here for chronic back pain, DJD of the thumb, and vasomotor symptoms related to perimenopausal status. Please see the A&P for the status of the pt's chronic medical problems.  She notes that for the last several months she has had a nonhealing lesion on the left side of her nose. She denies any trauma to the area and had no preceding growth. She has kept it covered with a bandage and it is slightly improved from its initial ulceration but has yet to completely heal over. It is not painful and does not drain. She denies any substances such as makeup or metal objects that could be responsible for a contact dermatitis. She's had no other similar lesions before.  Review of Systems  Constitutional: Negative for activity change, appetite change and unexpected weight change.  HENT: Negative for nosebleeds.   Respiratory: Negative for chest tightness, shortness of breath and wheezing.   Cardiovascular: Positive for palpitations. Negative for chest pain and leg swelling.       Palpitations are becoming much less frequent.  Gastrointestinal: Negative for abdominal pain, nausea and vomiting.  Genitourinary:       Periods are less frequent and vasomotor symptoms are unchanged.  Musculoskeletal: Positive for back pain.  Skin: Positive for wound. Negative for color change and rash.      Objective:   Physical Exam  Constitutional: She is oriented to person, place, and time. She appears well-developed and well-nourished. No distress.  HENT:  Head: Normocephalic and atraumatic.  Nose: Nose lacerations present. No sinus tenderness or nasal deformity. No epistaxis.    Eyes: Conjunctivae are normal. Right eye exhibits no discharge. Left eye exhibits no discharge. No scleral icterus.  Musculoskeletal: Normal range of motion. She exhibits no edema.  Neurological: She is alert and oriented  to person, place, and time. She exhibits normal muscle tone.  Skin: Skin is warm and dry. No rash noted. She is not diaphoretic. No erythema. No pallor.  Psychiatric: She has a normal mood and affect. Her behavior is normal. Judgment and thought content normal.  Nursing note and vitals reviewed.     Assessment & Plan:   Please see problem oriented charting.

## 2016-06-25 NOTE — Assessment & Plan Note (Signed)
Assessment  As she is moving through paramedic positive her migraine headaches are improving and becoming less frequent and less intense. This is as expected given the previous nature of her migraines worsening with her periods.  Plan  We will continue to follow her symptoms expectantly with the hopes that her migraines will resolve once she enters menopause.

## 2016-09-24 ENCOUNTER — Other Ambulatory Visit: Payer: Self-pay | Admitting: Internal Medicine

## 2016-09-24 DIAGNOSIS — G8929 Other chronic pain: Secondary | ICD-10-CM

## 2016-09-24 DIAGNOSIS — M545 Low back pain: Principal | ICD-10-CM

## 2016-09-24 MED ORDER — HYDROCODONE-ACETAMINOPHEN 5-325 MG PO TABS: 1.0000 | ORAL_TABLET | Freq: Four times a day (QID) | ORAL | 0 refills | Status: DC | PRN

## 2016-10-27 IMAGING — DX DG HAND COMPLETE 3+V*R*
3 series · 3 of 3 positions shown · non-contrast
Comparison: None.

CLINICAL DATA: Right thumb pain/swelling

EXAM:
RIGHT HAND - COMPLETE 3+ VIEW

[x hand pa right]
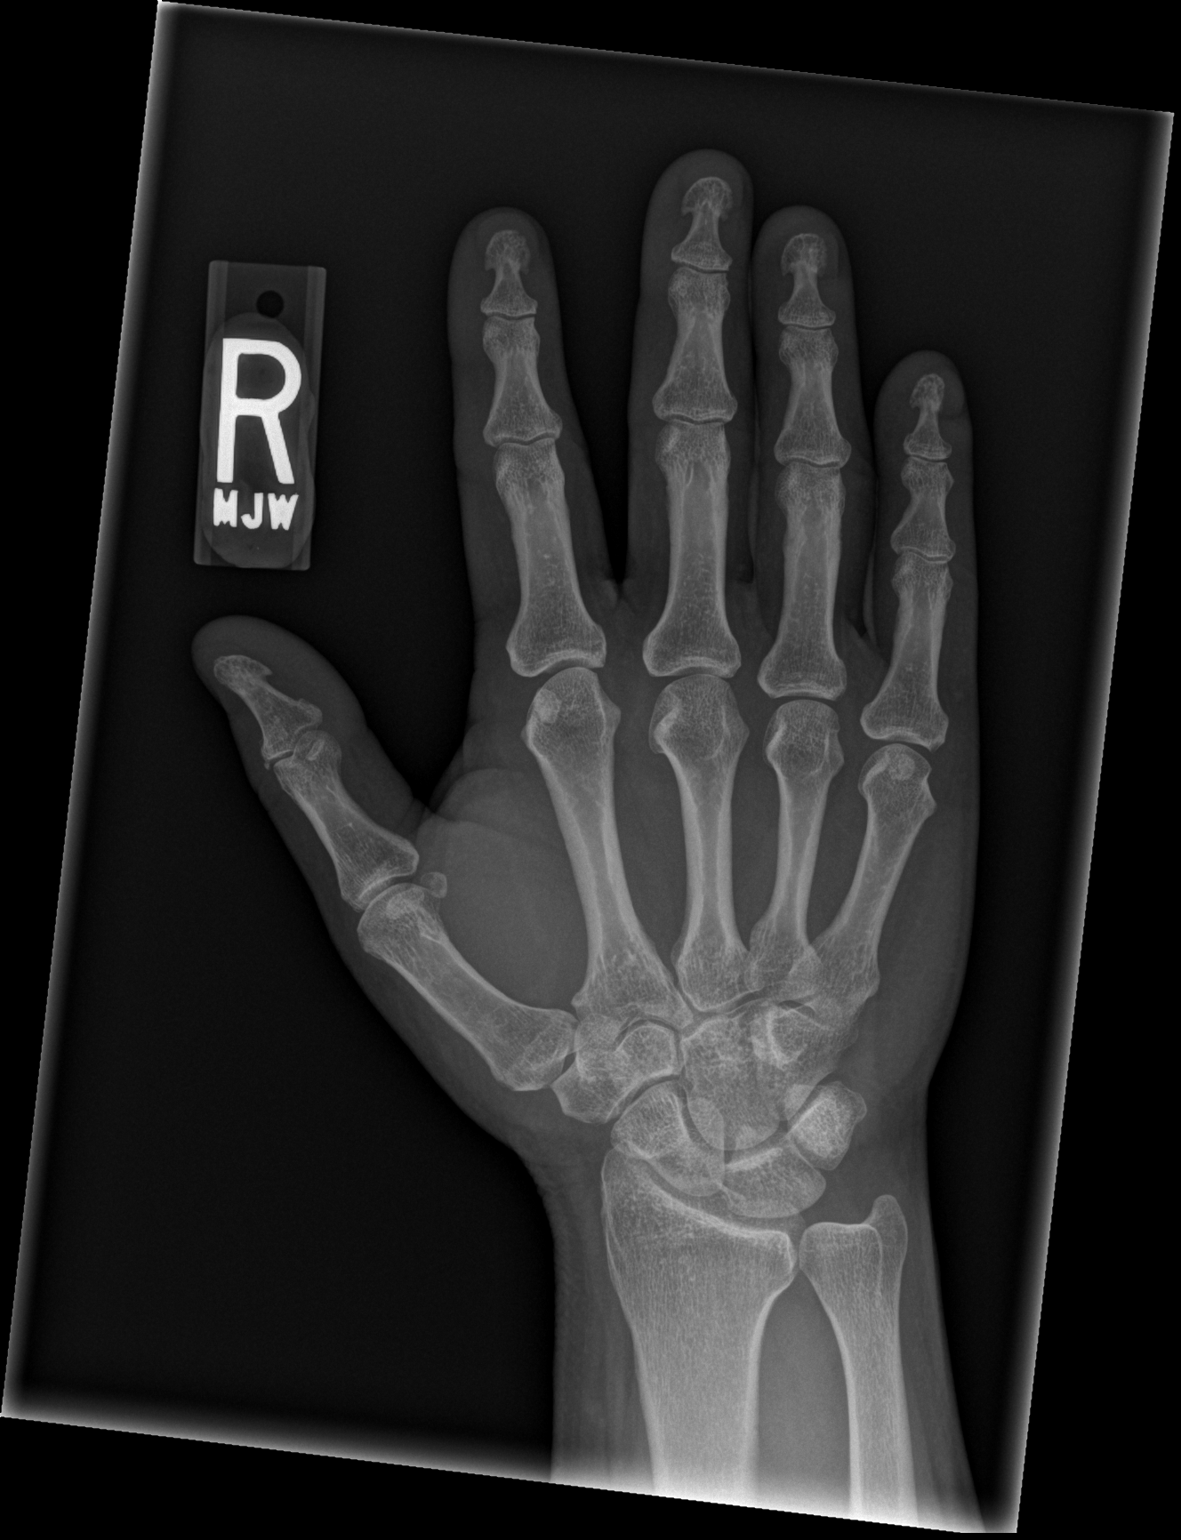

[x hand obl right]
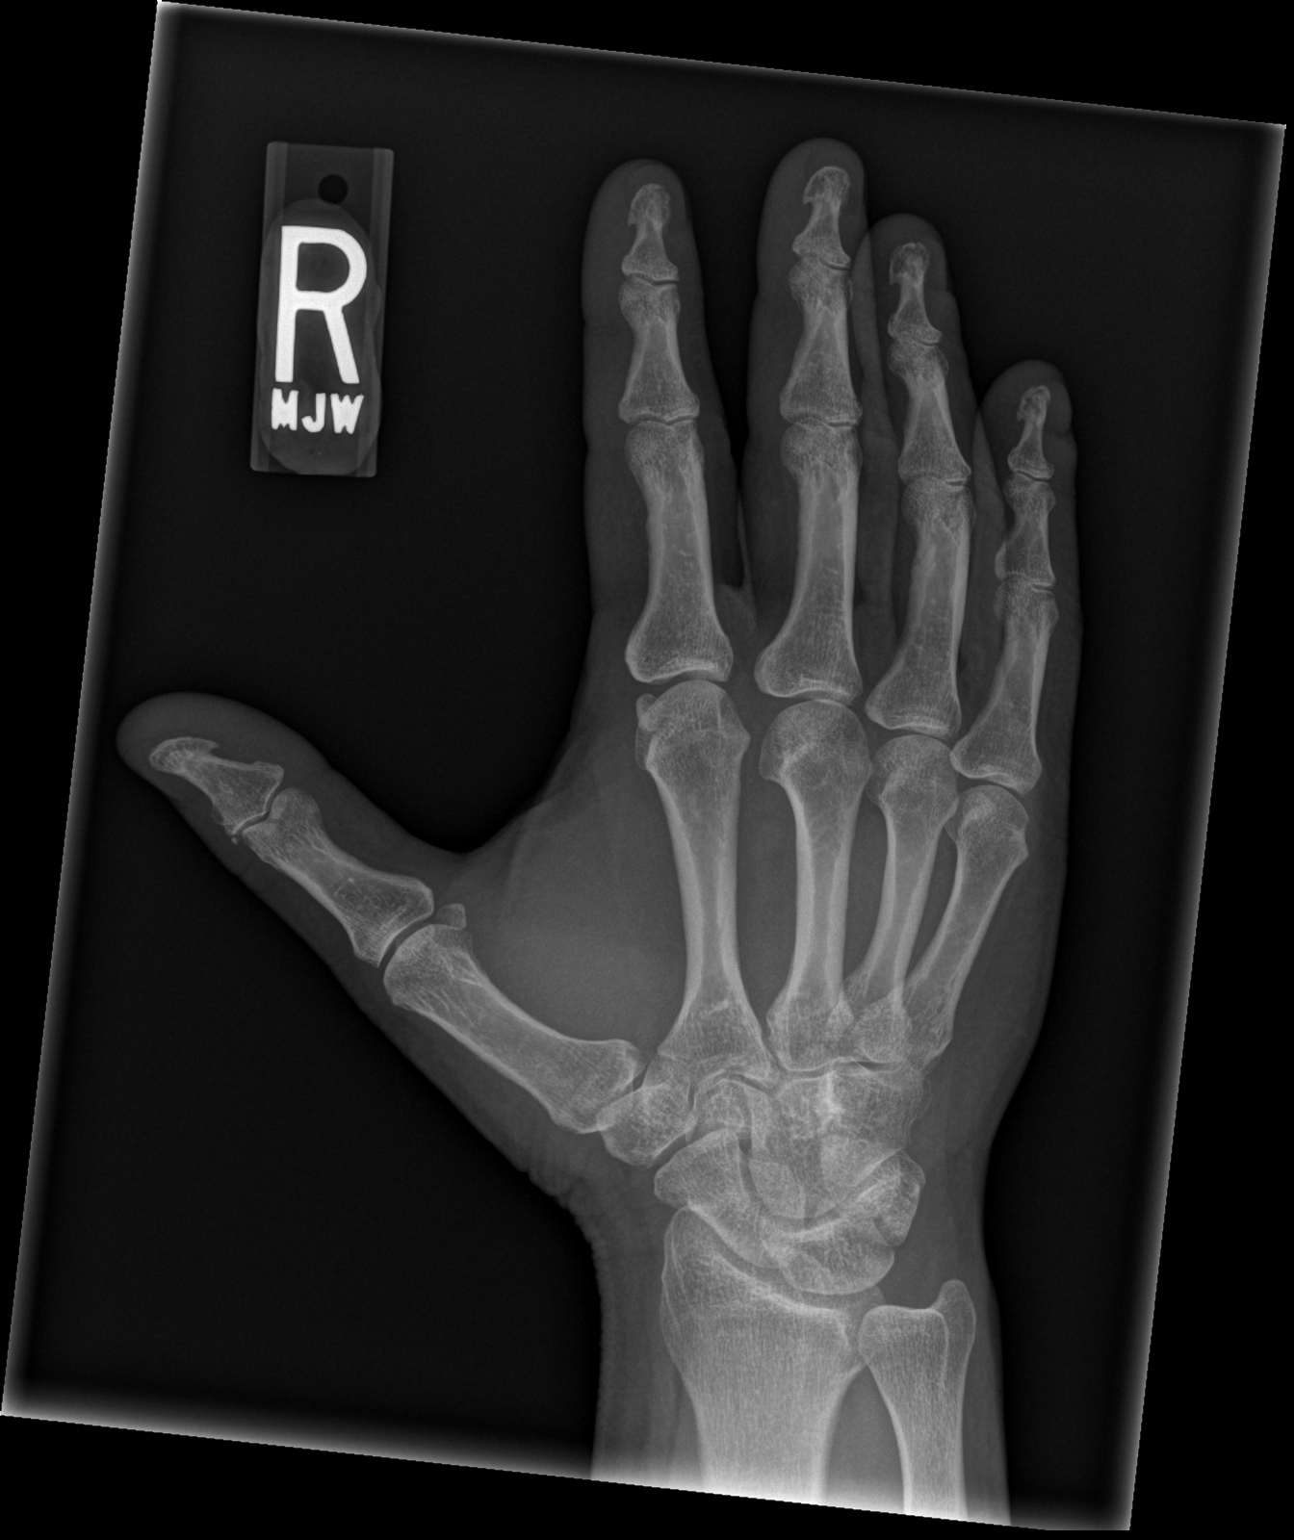

[x hand lat right]
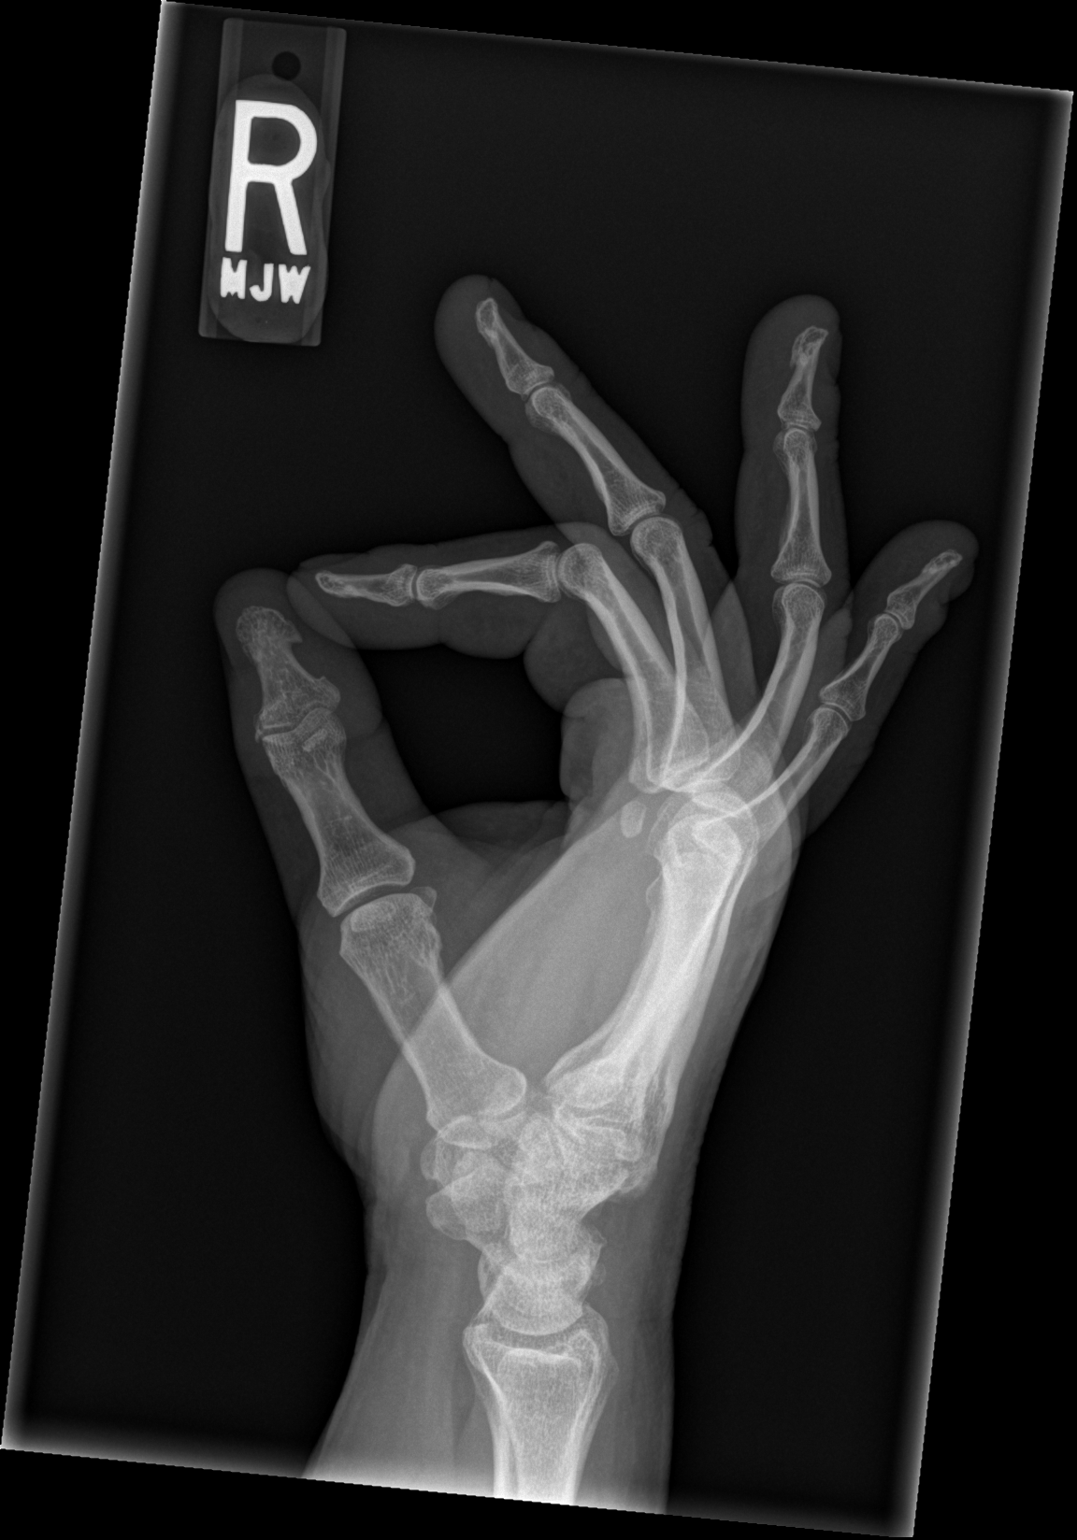

[3 of 3 positions shown; findings below may reference images not displayed]

FINDINGS: Lucency at the medial/ ulnar base of the 1st metacarpal, possibly
reflecting erosion versus subchondral cyst, best visualized on the
oblique view.

Joint spaces are otherwise preserved.

No definite fracture is seen.
IMPRESSION: Erosion versus subchondral cyst at the medial base of the 1st
metacarpal, suspicious for erosive versus degenerative arthropathy.

## 2017-01-02 ENCOUNTER — Emergency Department (HOSPITAL_BASED_OUTPATIENT_CLINIC_OR_DEPARTMENT_OTHER)
Admission: EM | Admit: 2017-01-02 | Discharge: 2017-01-02 | Disposition: A | Discharge status: Home / Self Care | Payer: Self-pay | Attending: Emergency Medicine | Admitting: Emergency Medicine

## 2017-01-02 ENCOUNTER — Encounter (HOSPITAL_BASED_OUTPATIENT_CLINIC_OR_DEPARTMENT_OTHER): Payer: Self-pay | Admitting: *Deleted

## 2017-01-02 DIAGNOSIS — J069 Acute upper respiratory infection, unspecified: Secondary | ICD-10-CM | POA: Insufficient documentation

## 2017-01-02 DIAGNOSIS — Z791 Long term (current) use of non-steroidal anti-inflammatories (NSAID): Secondary | ICD-10-CM | POA: Insufficient documentation

## 2017-01-02 DIAGNOSIS — Z87891 Personal history of nicotine dependence: Secondary | ICD-10-CM | POA: Insufficient documentation

## 2017-01-02 DIAGNOSIS — I10 Essential (primary) hypertension: Secondary | ICD-10-CM | POA: Insufficient documentation

## 2017-01-02 DIAGNOSIS — B9789 Other viral agents as the cause of diseases classified elsewhere: Secondary | ICD-10-CM

## 2017-01-02 DIAGNOSIS — Z79899 Other long term (current) drug therapy: Secondary | ICD-10-CM | POA: Insufficient documentation

## 2017-01-02 MED ORDER — FLUTICASONE PROPIONATE 50 MCG/ACT NA SUSP: 2.0000 | Freq: Every day | NASAL | 12 refills | Status: AC

## 2017-01-02 MED ORDER — BENZONATATE 100 MG PO CAPS: 100.0000 mg | ORAL_CAPSULE | Freq: Three times a day (TID) | ORAL | 0 refills | Status: AC

## 2017-01-02 MED ORDER — OSELTAMIVIR PHOSPHATE 75 MG PO CAPS: 75.0000 mg | ORAL_CAPSULE | Freq: Every day | ORAL | 0 refills | Status: AC

## 2017-01-02 NOTE — Discharge Instructions (Addendum)
Your symptoms sound like he had a viral upper respiratory tract infection. Have given the Flonase for congestion. May use Tessalon for cough. May take the Tamiflu if you would like for flu prevention. Stay hydrated plenty of fluids. May use over-the-counter decongestants. Warm steam will help with congestion. If her symptoms last longer than 10 days need to be reassessed for possible need for antibiotics.

## 2017-01-02 NOTE — ED Provider Notes (Signed)
[facility] Note   CSN: [id number] Arrival date & time: 01/02/17  1422  By signing my name below, I, Modena JanskyAlbert Thayil, attest that this documentation has been prepared under the direction and in the presence of non-physician practitioner, Azucena Kubayler Sharron Simpson, PA-C. Electronically Signed: Modena JanskyAlbert Thayil, Scribe. 01/02/2017. 4:22 PM.  History   Chief Complaint Chief Complaint  Patient presents with  . Facial Pain   The history is provided by the patient. No language interpreter was used.   HPI Comments: Emily Le is a 56 y.o. female who presents to the Emergency Department complaining of intermittent moderate cough that started about 2 days ago. She states she has been having gradually worsening URI-like symptoms. She describes the cough as mild and dry. She reports associated symptoms of headache, wheezing, rhinorrhea, sore throat, and sinus pressure/pain. She denies any fever, generalized body aches, Nausea, emesis, abdominal pain or other compaints. Denies any sick contact. Did not get flu Vaccination this year.    PCP: Rocco SereneKLIMA, LAWRENCE D, MD  Past Medical History:  Diagnosis Date  . Allergic rhinitis 11/08/2014   Worse in Spring and Fall   . Chronic low back pain 11/08/2014  . Essential hypertension 06/25/2016  . Essential hypertension   . Gastroesophageal reflux disease without esophagitis 11/08/2014  . Migraine headache without aura 11/08/2014   Worse during periods  . Osteoarthritis of right thumb 08/22/2015  . Perimenopause 11/08/2014   With vasomotor symptoms and occasional irritability    Patient Active Problem List   Diagnosis Date Noted  . Essential hypertension 06/25/2016  . External nasal lesion 06/25/2016  . Intermittent palpitations 08/22/2015  . Osteoarthritis of right thumb 08/22/2015  . Chronic low back pain 11/08/2014  . Gastroesophageal reflux disease without esophagitis 11/08/2014  . Migraine headache without aura 11/08/2014  .  Perimenopause 11/08/2014  . Allergic rhinitis 11/08/2014  . Obesity (BMI 30.0-34.9) 11/08/2014  . Healthcare maintenance 11/08/2014    Past Surgical History:  Procedure Laterality Date  . APPENDECTOMY N/A 1991  . CHOLECYSTECTOMY N/A 1999   For symtomatic cholelithiasis  . EXPLORATORY LAPAROTOMY N/A 1980  . LAPAROSCOPIC LYSIS OF ADHESIONS N/A 1994   Open procedure  . TUBAL LIGATION Bilateral 2002    OB History    No data available       Home Medications    Prior to Admission medications   Medication Sig Start Date End Date Taking? Authorizing Provider  cyclobenzaprine (FLEXERIL) 5 MG tablet Take 1 tablet (5 mg total) by mouth every 12 (twelve) hours as needed for muscle spasms. 01/22/16  Yes Doneen PoissonLawrence Klima, MD  HYDROcodone-acetaminophen (NORCO/VICODIN) 5-325 MG tablet Take 1-2 tablets by mouth every 6 (six) hours as needed for severe pain. 09/24/16  Yes Doneen PoissonLawrence Klima, MD  ibuprofen (ADVIL,MOTRIN) 800 MG tablet Take 1 tablet (800 mg total) by mouth every 8 (eight) hours as needed for moderate pain. 11/08/14  Yes Doneen PoissonLawrence Klima, MD    Family History Family History  Problem Relation Age of Onset  . Diabetes Mellitus II Mother   . Coronary artery disease Mother   . Hypertension Mother   . Hyperlipidemia Mother   . Osteoarthritis Mother   . Osteopenia Mother   . Hypothyroidism Mother   . Cancer Father     Metastatic  . COPD Sister   . Peptic Ulcer Disease Sister   . Cerebrovascular Accident Brother   . Healthy Daughter   . Hyperlipidemia Sister   . Hypertension Sister   . GER disease Sister   .  Healthy Sister   . Healthy Daughter     Social History Social History  Substance Use Topics  . Smoking status: Former Smoker    Packs/day: 0.25    Years: 15.00    Types: Cigarettes    Quit date: 06/22/1993  . Smokeless tobacco: Never Used  . Alcohol use 0.0 oz/week     Comment: rarely     Allergies   Sulfa antibiotics   Review of Systems Review of Systems    Constitutional: Negative for fever.  HENT: Positive for rhinorrhea, sinus pain, sinus pressure and sore throat.   Respiratory: Positive for cough (Dry) and wheezing.   Neurological: Positive for headaches.  All other systems reviewed and are negative.    Physical Exam Updated Vital Signs BP 141/89 (BP Location: Left Arm)   Pulse 96   Temp 98.9 F (37.2 C) (Oral)   Resp 18   Ht 5\' 3"  (1.6 m)   Wt 170 lb (77.1 kg)   LMP 10/22/2016 (Approximate)   SpO2 98%   BMI 30.11 kg/m   Physical Exam  Constitutional: She appears well-developed and well-nourished. No distress.  Non-toxic appearing. NAD.   HENT:  Head: Normocephalic and atraumatic.  Right Ear: Tympanic membrane, external ear and ear canal normal.  Left Ear: Tympanic membrane, external ear and ear canal normal.  Nose: Rhinorrhea present. Right sinus exhibits no maxillary sinus tenderness and no frontal sinus tenderness. Left sinus exhibits no maxillary sinus tenderness and no frontal sinus tenderness.  Mouth/Throat: Uvula is midline and mucous membranes are normal. Posterior oropharyngeal erythema present. No oropharyngeal exudate, posterior oropharyngeal edema or tonsillar abscesses. Tonsils are 1+ on the right. Tonsils are 1+ on the left. No tonsillar exudate.  Eyes: Conjunctivae are normal.  Neck: Normal range of motion. Neck supple.  Cardiovascular: Normal rate and regular rhythm.   Pulmonary/Chest: Effort normal and breath sounds normal. No respiratory distress. She has no wheezes.  Abdominal: Soft.  Musculoskeletal: Normal range of motion.  Lymphadenopathy:    She has no cervical adenopathy.  Neurological: She is alert.  Skin: Skin is warm and dry.  Psychiatric: She has a normal mood and affect.  Nursing note and vitals reviewed.    ED Treatments / Results  DIAGNOSTIC STUDIES: Oxygen Saturation is 98% on RA, normal by my interpretation.    COORDINATION OF CARE: 4:26 PM- Pt advised of plan for treatment and pt  agrees.  Labs (all labs ordered are listed, but only abnormal results are displayed) Labs Reviewed - No data to display  EKG  EKG Interpretation None       Radiology No results found.  Procedures Procedures (including critical care time)  Medications Ordered in ED Medications - No data to display   Initial Impression / Assessment and Plan / ED Course  I have reviewed the triage vital signs and the nursing notes.  Pertinent labs & imaging results that were available during my care of the patient were reviewed by me and considered in my medical decision making (see chart for details).     Patient resents to the ED with complaints of rhinorrhea, sinus congestion, nonproductive cough. Mild to moderate symptoms of clear/yellow nasal discharge/congestion and scratchy throat with cough for less than 10 days.  Patient is afebrile.  No concern for acute bacterial rhinosinusitis; likely viral in nature.  Lungs are clear to auscultation bilaterally. No indications for chest x-ray. Throat without signs of any infection. Mild erythema. Daughter was diagnosed with influenza today. We will give  prophylaxis prescription for Tamiflu. Also given scripts for Tessalon and Flonase. Patient discharged with symptomatic treatment.  Patient instructions given for warm saline nasal washes.  Recommendations for follow-up with primary care physician in 4-5 days if symptoms not improving for possible antibiotics. Patient is agreeable to the above plan. She is nontoxic appearing. She is no acute distress. Given strict return precautions.   Final Clinical Impressions(s) / ED Diagnoses   Final diagnoses:  Viral URI with cough    New Prescriptions New Prescriptions   BENZONATATE (TESSALON) 100 MG CAPSULE    Take 1 capsule (100 mg total) by mouth every 8 (eight) hours.   FLUTICASONE (FLONASE) 50 MCG/ACT NASAL SPRAY    Place 2 sprays into both nostrils daily.   OSELTAMIVIR (TAMIFLU) 75 MG CAPSULE    Take 1  capsule (75 mg total) by mouth daily.   I personally performed the services described in this documentation, which was scribed in my presence. The recorded information has been reviewed and is accurate.     Rise Mu, PA-C 01/02/17 1654    Tilden Fossa, MD 01/03/17 [phone]

## 2017-01-02 NOTE — ED Triage Notes (Signed)
Pt reports sinus pressure and sore throat x2-3 days. Denies fever, n/v/d. Reports occasional cough.

## 2017-01-13 ENCOUNTER — Telehealth: Payer: Self-pay | Admitting: Internal Medicine

## 2017-01-13 NOTE — Telephone Encounter (Signed)
APT. REMINDER CALL, LMTCB °

## 2017-01-14 ENCOUNTER — Ambulatory Visit: Payer: Self-pay | Admitting: Internal Medicine

## 2017-01-14 ENCOUNTER — Other Ambulatory Visit: Payer: Self-pay | Admitting: Internal Medicine

## 2017-01-14 ENCOUNTER — Encounter: Payer: Self-pay | Admitting: Internal Medicine

## 2017-01-14 VITALS — BP 146/85 | HR 70 | Wt 182.4 lb

## 2017-01-14 DIAGNOSIS — M545 Low back pain, unspecified: Secondary | ICD-10-CM

## 2017-01-14 DIAGNOSIS — G8929 Other chronic pain: Secondary | ICD-10-CM

## 2017-01-14 MED ORDER — HYDROCODONE-ACETAMINOPHEN 5-325 MG PO TABS: 1.0000 | ORAL_TABLET | Freq: Four times a day (QID) | ORAL | 0 refills | Status: DC | PRN

## 2017-01-14 NOTE — Progress Notes (Signed)
Ms. Emily Le asked that we cancel her appointment and spend the extra time addressing her husband's issues.  She was given three prescriptions of her hydrocodone-acetaminophen to get her through May.

## 2017-07-11 ENCOUNTER — Other Ambulatory Visit: Payer: Self-pay | Admitting: *Deleted

## 2017-07-11 DIAGNOSIS — G8929 Other chronic pain: Secondary | ICD-10-CM

## 2017-07-11 DIAGNOSIS — M545 Low back pain: Principal | ICD-10-CM

## 2017-07-11 MED ORDER — HYDROCODONE-ACETAMINOPHEN 5-325 MG PO TABS: 1.0000 | ORAL_TABLET | Freq: Four times a day (QID) | ORAL | 0 refills | Status: DC | PRN

## 2017-07-11 MED ORDER — HYDROCODONE-ACETAMINOPHEN 5-325 MG PO TABS: 1.0000 | ORAL_TABLET | Freq: Four times a day (QID) | ORAL | 0 refills | Status: AC | PRN

## 2017-07-11 NOTE — Telephone Encounter (Signed)
He last refill was in May.  Thus, it appears she does not require #30/month.  I have begun to wean down the hydrocodone-acetaminophen 5-325 mg to 1-2 tablet every 6 hours dispense #25, #22, and #20 over the next three months respectively.  She was handed these three prescriptions that should get her through to November.

## 2018-11-10 ENCOUNTER — Ambulatory Visit: Payer: Self-pay | Admitting: Internal Medicine

## 2019-02-18 ENCOUNTER — Encounter: Payer: Self-pay | Admitting: *Deleted
